# Patient Record
Sex: Male | Born: 1990 | Hispanic: No | Marital: Single | Smoking: Never smoker
Health system: Southern US, Community
[De-identification: ages and names within clinical notes are randomized; demographics above are authoritative.]

---

## 2013-06-17 ENCOUNTER — Ambulatory Visit: Payer: Medicaid Other | Attending: Internal Medicine | Admitting: Internal Medicine

## 2013-06-17 VITALS — BP 115/67 | HR 78 | Temp 98.1°F | Ht 69.0 in | Wt 114.6 lb

## 2013-06-17 DIAGNOSIS — Z008 Encounter for other general examination: Secondary | ICD-10-CM | POA: Insufficient documentation

## 2013-06-17 DIAGNOSIS — R1013 Epigastric pain: Secondary | ICD-10-CM | POA: Insufficient documentation

## 2013-06-17 DIAGNOSIS — M549 Dorsalgia, unspecified: Secondary | ICD-10-CM

## 2013-06-17 DIAGNOSIS — K219 Gastro-esophageal reflux disease without esophagitis: Secondary | ICD-10-CM | POA: Insufficient documentation

## 2013-06-17 DIAGNOSIS — H9190 Unspecified hearing loss, unspecified ear: Secondary | ICD-10-CM

## 2013-06-17 LAB — CBC WITH DIFFERENTIAL/PLATELET
BASOS ABS: 0 10*3/uL (ref 0.0–0.1)
Basophils Relative: 0 % (ref 0–1)
Eosinophils Absolute: 0.1 10*3/uL (ref 0.0–0.7)
Eosinophils Relative: 2 % (ref 0–5)
HCT: 43.3 % (ref 39.0–52.0)
HEMOGLOBIN: 15.2 g/dL (ref 13.0–17.0)
Lymphocytes Relative: 29 % (ref 12–46)
Lymphs Abs: 2 10*3/uL (ref 0.7–4.0)
MCH: 29.7 pg (ref 26.0–34.0)
MCHC: 35.1 g/dL (ref 30.0–36.0)
MCV: 84.7 fL (ref 78.0–100.0)
MONOS PCT: 13 % — AB (ref 3–12)
Monocytes Absolute: 0.9 10*3/uL (ref 0.1–1.0)
Neutro Abs: 3.8 10*3/uL (ref 1.7–7.7)
Neutrophils Relative %: 56 % (ref 43–77)
Platelets: 239 10*3/uL (ref 150–400)
RBC: 5.11 MIL/uL (ref 4.22–5.81)
RDW: 13.6 % (ref 11.5–15.5)
WBC: 6.8 10*3/uL (ref 4.0–10.5)

## 2013-06-17 LAB — COMPLETE METABOLIC PANEL WITH GFR
ALBUMIN: 4.4 g/dL (ref 3.5–5.2)
ALK PHOS: 118 U/L — AB (ref 39–117)
ALT: 17 U/L (ref 0–53)
AST: 16 U/L (ref 0–37)
BUN: 10 mg/dL (ref 6–23)
CO2: 27 mEq/L (ref 19–32)
Calcium: 9.5 mg/dL (ref 8.4–10.5)
Chloride: 102 mEq/L (ref 96–112)
Creat: 0.63 mg/dL (ref 0.50–1.35)
GFR, Est African American: >89 mL/min
GFR, Est Non African American: >89 mL/min
GLUCOSE: 83 mg/dL (ref 70–99)
Potassium: 4.1 mEq/L (ref 3.5–5.3)
Sodium: 140 mEq/L (ref 135–145)
Total Bilirubin: 0.7 mg/dL (ref 0.2–1.2)
Total Protein: 7.1 g/dL (ref 6.0–8.3)

## 2013-06-17 LAB — LIPID PANEL
CHOL/HDL RATIO: 2.9 ratio
Cholesterol: 144 mg/dL (ref 0–200)
HDL: 49 mg/dL (ref >39)
LDL CALC: 86 mg/dL (ref 0–99)
Triglycerides: 47 mg/dL (ref <150)
VLDL: 9 mg/dL (ref 0–40)

## 2013-06-17 MED ORDER — MELOXICAM 15 MG PO TABS: 15.0000 mg | ORAL_TABLET | Freq: Every day | ORAL | Status: DC

## 2013-06-17 MED ORDER — PANTOPRAZOLE SODIUM 40 MG PO TBEC: 40.0000 mg | DELAYED_RELEASE_TABLET | Freq: Every day | ORAL | Status: DC

## 2013-06-17 MED ORDER — CYCLOBENZAPRINE HCL 10 MG PO TABS: 10.0000 mg | ORAL_TABLET | Freq: Every day | ORAL | Status: DC
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
## 2013-06-17 NOTE — Progress Notes (Signed)
Patient presents in clinic to establish care. He is concerned about vomiting he has been having on and of that he states has a "green" color to it

## 2013-06-18 LAB — TSH: TSH: 3.345 u[IU]/mL (ref 0.350–4.500)

## 2013-06-27 ENCOUNTER — Telehealth: Payer: Self-pay | Admitting: Emergency Medicine

## 2013-06-27 NOTE — Telephone Encounter (Signed)
Message copied by Darlis LoanSMITH, JILL D on Mon Jun 27, 2013  5:28 PM ------      Message from: Susie CassetteABROL MD, Germain OsgoodNAYANA      Created: Wed Jun 22, 2013  4:13 PM       Notify patient of the labs are normal ------

## 2013-06-27 NOTE — Telephone Encounter (Signed)
Pt given lab results. Questioning imaging ordered for back pain. Please f/u

## 2013-07-14 ENCOUNTER — Encounter: Payer: Self-pay | Admitting: Internal Medicine

## 2013-07-14 ENCOUNTER — Ambulatory Visit: Payer: Medicaid Other | Attending: Internal Medicine | Admitting: Internal Medicine

## 2013-07-14 VITALS — BP 109/69 | HR 56 | Temp 98.2°F | Resp 16 | Ht 70.0 in | Wt 144.0 lb

## 2013-07-14 DIAGNOSIS — M545 Low back pain, unspecified: Secondary | ICD-10-CM | POA: Insufficient documentation

## 2013-07-14 DIAGNOSIS — Z791 Long term (current) use of non-steroidal anti-inflammatories (NSAID): Secondary | ICD-10-CM | POA: Insufficient documentation

## 2013-07-14 DIAGNOSIS — Z79899 Other long term (current) drug therapy: Secondary | ICD-10-CM | POA: Insufficient documentation

## 2013-07-14 DIAGNOSIS — M549 Dorsalgia, unspecified: Secondary | ICD-10-CM

## 2013-07-14 MED ORDER — CYCLOBENZAPRINE HCL 10 MG PO TABS: 10.0000 mg | ORAL_TABLET | Freq: Every day | ORAL | Status: DC

## 2013-07-14 MED ORDER — MELOXICAM 15 MG PO TABS: 15.0000 mg | ORAL_TABLET | Freq: Every day | ORAL | Status: DC

## 2013-07-14 NOTE — Patient Instructions (Signed)
Back Pain, Adult Low back pain is very common. About 1 in 5 people have back pain.The cause of low back pain is rarely dangerous. The pain often gets better over time.About half of people with a sudden onset of back pain feel better in just 2 weeks. About 8 in 10 people feel better by 6 weeks.  CAUSES Some common causes of back pain include:  Strain of the muscles or ligaments supporting the spine.  Wear and tear (degeneration) of the spinal discs.  Arthritis.  Direct injury to the back. DIAGNOSIS Most of the time, the direct cause of low back pain is not known.However, back pain can be treated effectively even when the exact cause of the pain is unknown.Answering your caregiver's questions about your overall health and symptoms is one of the most accurate ways to make sure the cause of your pain is not dangerous. If your caregiver needs more information, he or she may order lab work or imaging tests (X-rays or MRIs).However, even if imaging tests show changes in your back, this usually does not require surgery. HOME CARE INSTRUCTIONS For many people, back pain returns.Since low back pain is rarely dangerous, it is often a condition that people can learn to manageon their own.   Remain active. It is stressful on the back to sit or stand in one place. Do not sit, drive, or stand in one place for more than 30 minutes at a time. Take short walks on level surfaces as soon as pain allows.Try to increase the length of time you walk each day.  Do not stay in bed.Resting more than 1 or 2 days can delay your recovery.  Do not avoid exercise or work.Your body is made to move.It is not dangerous to be active, even though your back may hurt.Your back will likely heal faster if you return to being active before your pain is gone.  Pay attention to your body when you bend and lift. Many people have less discomfortwhen lifting if they bend their knees, keep the load close to their bodies,and  avoid twisting. Often, the most comfortable positions are those that put less stress on your recovering back.  Find a comfortable position to sleep. Use a firm mattress and lie on your side with your knees slightly bent. If you lie on your back, put a pillow under your knees.  Only take over-the-counter or prescription medicines as directed by your caregiver. Over-the-counter medicines to reduce pain and inflammation are often the most helpful.Your caregiver may prescribe muscle relaxant drugs.These medicines help dull your pain so you can more quickly return to your normal activities and healthy exercise.  Put ice on the injured area.  Put ice in a plastic bag.  Place a towel between your skin and the bag.  Leave the ice on for 15-20 minutes, 03-04 times a day for the first 2 to 3 days. After that, ice and heat may be alternated to reduce pain and spasms.  Ask your caregiver about trying back exercises and gentle massage. This may be of some benefit.  Avoid feeling anxious or stressed.Stress increases muscle tension and can worsen back pain.It is important to recognize when you are anxious or stressed and learn ways to manage it.Exercise is a great option. SEEK MEDICAL CARE IF:  You have pain that is not relieved with rest or medicine.  You have pain that does not improve in 1 week.  You have new symptoms.  You are generally not feeling well. SEEK   IMMEDIATE MEDICAL CARE IF:   You have pain that radiates from your back into your legs.  You develop new bowel or bladder control problems.  You have unusual weakness or numbness in your arms or legs.  You develop nausea or vomiting.  You develop abdominal pain.  You feel faint. Document Released: 03/17/2005 Document Revised: 09/16/2011 Document Reviewed: 08/05/2010 [employer] Patient Information 2014 [employer].  
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
## 2013-07-14 NOTE — Progress Notes (Signed)
Pt is here following up on his chronic back pain.

## 2013-07-18 ENCOUNTER — Ambulatory Visit (HOSPITAL_COMMUNITY)
Admission: RE | Admit: 2013-07-18 | Discharge: 2013-07-18 | Disposition: A | Discharge status: Home / Self Care | Payer: Medicaid Other | Source: Ambulatory Visit | Attending: Internal Medicine | Admitting: Internal Medicine

## 2013-07-18 ENCOUNTER — Telehealth: Payer: Self-pay | Admitting: *Deleted

## 2013-07-18 DIAGNOSIS — M545 Low back pain, unspecified: Secondary | ICD-10-CM | POA: Insufficient documentation

## 2013-07-18 DIAGNOSIS — M549 Dorsalgia, unspecified: Secondary | ICD-10-CM

## 2013-07-18 NOTE — Telephone Encounter (Signed)
Left message

## 2013-07-18 NOTE — Telephone Encounter (Signed)
Message copied by Raynelle CharyWINFREE, Vickie Melnik R on Mon Jul 18, 2013  2:19 PM ------      Message from: Jeanann LewandowskyJEGEDE, OLUGBEMIGA E      Created: Mon Jul 18, 2013  1:34 PM       Please inform patient that his x-ray is normal ------

## 2013-07-18 NOTE — Telephone Encounter (Signed)
Pt is aware of his lab results. 

## 2013-07-18 NOTE — Telephone Encounter (Signed)
Message copied by Raynelle CharyWINFREE, Artemisa Sladek R on Mon Jul 18, 2013  2:34 PM ------      Message from: Jeanann LewandowskyJEGEDE, OLUGBEMIGA E      Created: Mon Jul 18, 2013  1:34 PM       Please inform patient that his x-ray is normal ------

## 2013-09-08 ENCOUNTER — Ambulatory Visit
Admission: RE | Admit: 2013-09-08 | Discharge: 2013-09-08 | Disposition: A | Discharge status: Home / Self Care | Payer: No Typology Code available for payment source | Source: Ambulatory Visit | Attending: Infectious Disease | Admitting: Infectious Disease

## 2013-09-08 ENCOUNTER — Other Ambulatory Visit: Payer: Self-pay | Admitting: Infectious Disease

## 2013-09-08 DIAGNOSIS — R7611 Nonspecific reaction to tuberculin skin test without active tuberculosis: Secondary | ICD-10-CM

## 2013-11-22 ENCOUNTER — Ambulatory Visit: Payer: Medicaid Other | Attending: Internal Medicine

## 2013-12-29 ENCOUNTER — Ambulatory Visit: Payer: Self-pay | Attending: Internal Medicine | Admitting: *Deleted

## 2013-12-29 DIAGNOSIS — Z23 Encounter for immunization: Secondary | ICD-10-CM

## 2014-01-05 ENCOUNTER — Ambulatory Visit: Payer: Self-pay | Attending: Internal Medicine | Admitting: Internal Medicine

## 2014-01-05 ENCOUNTER — Encounter: Payer: Self-pay | Admitting: Internal Medicine

## 2014-01-05 VITALS — BP 110/60 | HR 50 | Temp 97.7°F | Resp 16 | Wt 126.0 lb

## 2014-01-05 DIAGNOSIS — Z791 Long term (current) use of non-steroidal anti-inflammatories (NSAID): Secondary | ICD-10-CM | POA: Insufficient documentation

## 2014-01-05 DIAGNOSIS — K219 Gastro-esophageal reflux disease without esophagitis: Secondary | ICD-10-CM

## 2014-01-05 DIAGNOSIS — Z79899 Other long term (current) drug therapy: Secondary | ICD-10-CM | POA: Insufficient documentation

## 2014-01-05 DIAGNOSIS — M545 Low back pain, unspecified: Secondary | ICD-10-CM

## 2014-01-05 DIAGNOSIS — Z23 Encounter for immunization: Secondary | ICD-10-CM | POA: Insufficient documentation

## 2014-01-05 DIAGNOSIS — G8929 Other chronic pain: Secondary | ICD-10-CM | POA: Insufficient documentation

## 2014-01-05 MED ORDER — MELOXICAM 15 MG PO TABS: 15.0000 mg | ORAL_TABLET | Freq: Every day | ORAL | Status: DC

## 2014-01-05 MED ORDER — PANTOPRAZOLE SODIUM 40 MG PO TBEC: 40.0000 mg | DELAYED_RELEASE_TABLET | Freq: Every day | ORAL | Status: DC

## 2014-01-05 MED ORDER — CYCLOBENZAPRINE HCL 10 MG PO TABS: 10.0000 mg | ORAL_TABLET | Freq: Every day | ORAL | Status: DC

## 2014-01-05 NOTE — Progress Notes (Signed)
Patient here for follow up on his back pain  

## 2014-01-05 NOTE — Progress Notes (Signed)
MRN: [id number] Name: Cory Francis  Sex: male Age: 23 y.o. DOB: [birth year]  Allergies: Review of patient's allergies indicates no known allergies.  Chief Complaint  Patient presents with  . Follow-up    HPI: Patient is 23 y.o. male who has to of chronic lower back pain, GERD comes today for followup patient reports improvement in the symptoms had an x-ray done in the past which was reported to be negative, patient takes meloxicam and Flexeril , also noticed his blood pressure is low side, repeat manual blood pressure is 110/60, he denies any headache dizziness chest and shortness of breath. Patient is up-to-date with flu shot.  History reviewed. No pertinent past medical history.  History reviewed. No pertinent past surgical history.    Medication List       This list is accurate as of: 01/05/14 10:38 AM.  Always use your most recent med list.               cyclobenzaprine 10 MG tablet  Commonly known as:  FLEXERIL  Take 1 tablet (10 mg total) by mouth at bedtime.     meloxicam 15 MG tablet  Commonly known as:  MOBIC  Take 1 tablet (15 mg total) by mouth daily.     pantoprazole 40 MG tablet  Commonly known as:  PROTONIX  Take 1 tablet (40 mg total) by mouth daily.        Meds ordered this encounter  Medications  . pantoprazole (PROTONIX) 40 MG tablet    Sig: Take 1 tablet (40 mg total) by mouth daily.    Dispense:  30 tablet    Refill:  3  . meloxicam (MOBIC) 15 MG tablet    Sig: Take 1 tablet (15 mg total) by mouth daily.    Dispense:  30 tablet    Refill:  3  . cyclobenzaprine (FLEXERIL) 10 MG tablet    Sig: Take 1 tablet (10 mg total) by mouth at bedtime.    Dispense:  30 tablet    Refill:  0    Immunization History  Administered Date(s) Administered  . Influenza,inj,Quad PF,36+ Mos 12/29/2013    Family History  Problem Relation Age of Onset  . Diabetes Maternal Uncle   . Stroke Maternal Grandmother     History  Substance Use Topics    . Smoking status: Never Smoker   . Smokeless tobacco: Not on file  . Alcohol Use: Not on file    Review of Systems   As noted in HPI  Filed Vitals:   01/05/14 1038  BP: 110/60  Pulse:   Temp:   Resp:     Physical Exam  Physical Exam  Constitutional: No distress.  Eyes: EOM are normal. Pupils are equal, round, and reactive to light.  Cardiovascular: Normal rate and regular rhythm.   Pulmonary/Chest: Breath sounds normal. No respiratory distress. He has no wheezes. He has no rales.  Musculoskeletal:  Minimal lower lumbar paraspinal tenderness, SLR test negative     CBC    Component Value Date/Time   WBC 6.8 06/17/2013 1221   RBC 5.11 06/17/2013 1221   HGB 15.2 06/17/2013 1221   HCT 43.3 06/17/2013 1221   PLT 239 06/17/2013 1221   MCV 84.7 06/17/2013 1221   LYMPHSABS 2.0 06/17/2013 1221   MONOABS 0.9 06/17/2013 1221   EOSABS 0.1 06/17/2013 1221   BASOSABS 0.0 06/17/2013 1221    CMP     Component Value Date/Time   NA 140 06/17/2013 1221  K 4.1 06/17/2013 1221   CL 102 06/17/2013 1221   CO2 27 06/17/2013 1221   GLUCOSE 83 06/17/2013 1221   BUN 10 06/17/2013 1221   CREATININE 0.63 06/17/2013 1221   CALCIUM 9.5 06/17/2013 1221   PROT 7.1 06/17/2013 1221   ALBUMIN 4.4 06/17/2013 1221   AST 16 06/17/2013 1221   ALT 17 06/17/2013 1221   ALKPHOS 118* 06/17/2013 1221   BILITOT 0.7 06/17/2013 1221   GFRNONAA >89 06/17/2013 1221   GFRAA >89 06/17/2013 1221    Lab Results  Component Value Date/Time   CHOL 144 06/17/2013 12:21 PM    No components found with this basename: hga1c    Lab Results  Component Value Date/Time   AST 16 06/17/2013 12:21 PM    Assessment and Plan  Gastroesophageal reflux disease, esophagitis presence not specified - Plan: Advised patient for lifestyle modification continue with pantoprazole (PROTONIX) 40 MG tablet  Chronic low back pain - Plan: Advised patient to apply heating pad, medication refill done. meloxicam (MOBIC) 15 MG tablet, cyclobenzaprine  (FLEXERIL) 10 MG tablet   Health Maintenance Flu shot  uptodate   Return in about 6 months (around 07/07/2014) for back pain.  Doris CheadleADVANI, Blakleigh Straw, MD

## 2014-01-13 ENCOUNTER — Ambulatory Visit: Payer: Medicaid Other | Admitting: Internal Medicine

## 2014-05-19 ENCOUNTER — Emergency Department (HOSPITAL_COMMUNITY)
Admission: EM | Admit: 2014-05-19 | Discharge: 2014-05-19 | Disposition: A | Discharge status: Home / Self Care | Payer: BLUE CROSS/BLUE SHIELD | Source: Home / Self Care | Attending: Family Medicine | Admitting: Family Medicine

## 2014-05-19 ENCOUNTER — Encounter (HOSPITAL_COMMUNITY): Payer: Self-pay

## 2014-05-19 ENCOUNTER — Other Ambulatory Visit: Payer: Self-pay

## 2014-05-19 DIAGNOSIS — S29011A Strain of muscle and tendon of front wall of thorax, initial encounter: Secondary | ICD-10-CM

## 2014-05-19 DIAGNOSIS — S2341XA Sprain of ribs, initial encounter: Secondary | ICD-10-CM

## 2014-05-19 NOTE — Discharge Instructions (Signed)
You have strained a small muscle in between your ribs on the left side of your chest. Please use tylenol or ibuprofen as directed on packaging for discomfort and expect improvement over the next several days. If symptoms do not improve over the next 7-10 days, please follow up with your doctor.  Muscle Strain A muscle strain (pulled muscle) happens when a muscle is stretched beyond normal length. It happens when a sudden, violent force stretches your muscle too far. Usually, a few of the fibers in your muscle are torn. Muscle strain is common in athletes. Recovery usually takes 1-2 weeks. Complete healing takes 5-6 weeks.  HOME CARE   Follow the PRICE method of treatment to help your injury get better. Do this the first 2-3 days after the injury:  Protect. Protect the muscle to keep it from getting injured again.  Rest. Limit your activity and rest the injured body part.  Ice. Put ice in a plastic bag. Place a towel between your skin and the bag. Then, apply the ice and leave it on from 15-20 minutes each hour. After the third day, switch to moist heat packs.  Compression. Use a splint or elastic bandage on the injured area for comfort. Do not put it on too tightly.  Elevate. Keep the injured body part above the level of your heart.  Only take medicine as told by your doctor.  Warm up before doing exercise to prevent future muscle strains. GET HELP IF:   You have more pain or puffiness (swelling) in the injured area.  You feel numbness, tingling, or notice a loss of strength in the injured area. MAKE SURE YOU:   Understand these instructions.  Will watch your condition.  Will get help right away if you are not doing well or get worse. Document Released: 12/25/2007 Document Revised: 01/05/2013 Document Reviewed: 10/14/2012 [facility] Patient Information 2015 [place], [place]. This information is not intended to replace advice given to you by your health care provider. Make sure you  discuss any questions you have with your health care provider.

## 2014-05-19 NOTE — ED Provider Notes (Signed)
CSN: [id number]     Arrival date & time 05/19/14  1157 History   First MD Initiated Contact with Patient 05/19/14 1221     Chief Complaint  Patient presents with  . Chest Pain   (Consider location/radiation/quality/duration/timing/severity/associated sxs/prior Treatment) HPI Comments: Patient noticed this discomfort while at work 2 days ago. 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 Works in [employer].  Is symptom free as long as he is not touching area of discomfort. No injury. No palpitations, dyspnea, diaphoresis, nausea, fever, rash, syncope.  PCP: [facility] Nonsmoker No personal or family hx of CAD  Patient is a 24 y.o. male presenting with chest pain. The history is provided by the patient.  Chest Pain Pain location:  L chest (small finger tip sized area of point tenderness at left breast in intercostal space just below left nipple.) Associated symptoms: no back pain, no dizziness and no weakness     History reviewed. No pertinent past medical history. History reviewed. No pertinent past surgical history. Family History  Problem Relation Age of Onset  . Diabetes Maternal Uncle   . Stroke Maternal Grandmother    History  Substance Use Topics  . Smoking status: Never Smoker   . Smokeless tobacco: Not on file  . Alcohol Use: Not on file    Review of Systems  Constitutional: Negative.   HENT: Negative.   Respiratory: Negative.   Cardiovascular: Positive for chest pain.       See HPI  Gastrointestinal: Negative.   Musculoskeletal: Negative for back pain.  Skin: Negative.   Neurological: Negative for dizziness, syncope, weakness and light-headedness.    Allergies  Review of patient's allergies indicates no known allergies.  Home Medications   Prior to Admission medications   Medication Sig Start Date End Date Taking? Authorizing Provider  cyclobenzaprine (FLEXERIL) 10 MG tablet Take 1 tablet (10 mg total) by mouth at bedtime. 01/05/14   Doris Cheadle, MD    meloxicam (MOBIC) 15 MG tablet Take 1 tablet (15 mg total) by mouth daily. 01/05/14   Doris Cheadle, MD  pantoprazole (PROTONIX) 40 MG tablet Take 1 tablet (40 mg total) by mouth daily. 01/05/14   Deepak Advani, MD   BP 109/64 mmHg  Pulse 60  Temp(Src) 98 F (36.7 C) (Oral)  Resp 20  SpO2 97% Physical Exam  Constitutional: He is oriented to person, place, and time. He appears well-developed and well-nourished. No distress.  HENT:  Head: Normocephalic and atraumatic.  Cardiovascular: Normal rate, regular rhythm and normal heart sounds.   Pulmonary/Chest: Effort normal and breath sounds normal. He exhibits tenderness. Left breast exhibits no inverted nipple, no mass, no nipple discharge and no skin change. Breasts are symmetrical.    Abdominal: Soft. Bowel sounds are normal. He exhibits no distension. There is no tenderness.  Musculoskeletal: Normal range of motion.  Neurological: He is alert and oriented to person, place, and time.  Skin: Skin is warm, dry and intact. No abrasion, no bruising, no ecchymosis, no laceration, no lesion and no rash noted. No erythema.  Psychiatric: He has a normal mood and affect. His behavior is normal.  Nursing note and vitals reviewed.   ED Course  Procedures (including critical care time) Labs Review Labs Reviewed - No data to display  Imaging Review No results found.   MDM   1. Intercostal muscle strain, initial encounter     Strain of left intercostal muscle. Tylenol or ibuprofen as directed on packaging. Expect gradual improvement over next  few days.     Ria ClockJennifer Lee H Presson, [place] 05/19/14 1259

## 2014-05-19 NOTE — ED Notes (Signed)
C/o 2 day duration o fpain in left anterior chest . Pain not exacerbated by breathing , ROM of arms, but c/o worse w his pressing on his left chest. Denies n/v/SOB/injury. W/d/color good

## 2014-05-19 NOTE — Progress Notes (Unsigned)
Patient came into the office today having some discomfort to his left Breast area Patient not sure if he pulled a muscle Denies any sob Patient was not in any distress Patient was instructed to go to the urgent care to be evaluated and possible chest x ray

## 2014-05-24 ENCOUNTER — Ambulatory Visit: Payer: Self-pay | Admitting: Internal Medicine

## 2014-06-14 ENCOUNTER — Ambulatory Visit: Payer: BLUE CROSS/BLUE SHIELD

## 2014-06-21 ENCOUNTER — Ambulatory Visit: Payer: BLUE CROSS/BLUE SHIELD | Attending: Internal Medicine | Admitting: Internal Medicine

## 2014-06-21 ENCOUNTER — Encounter: Payer: Self-pay | Admitting: Internal Medicine

## 2014-06-21 VITALS — BP 116/65 | HR 60 | Temp 98.6°F | Resp 16 | Wt 128.6 lb

## 2014-06-21 DIAGNOSIS — M545 Low back pain, unspecified: Secondary | ICD-10-CM

## 2014-06-21 DIAGNOSIS — G8929 Other chronic pain: Secondary | ICD-10-CM | POA: Diagnosis not present

## 2014-06-21 DIAGNOSIS — K219 Gastro-esophageal reflux disease without esophagitis: Secondary | ICD-10-CM | POA: Diagnosis not present

## 2014-06-21 DIAGNOSIS — R1013 Epigastric pain: Secondary | ICD-10-CM | POA: Diagnosis not present

## 2014-06-21 DIAGNOSIS — R109 Unspecified abdominal pain: Secondary | ICD-10-CM | POA: Diagnosis not present

## 2014-06-21 LAB — COMPLETE METABOLIC PANEL WITH GFR
ALBUMIN: 4.6 g/dL (ref 3.5–5.2)
ALK PHOS: 119 U/L — AB (ref 39–117)
ALT: 31 U/L (ref 0–53)
AST: 19 U/L (ref 0–37)
BUN: 10 mg/dL (ref 6–23)
CO2: 28 mEq/L (ref 19–32)
Calcium: 10.1 mg/dL (ref 8.4–10.5)
Chloride: 104 mEq/L (ref 96–112)
Creat: 0.66 mg/dL (ref 0.50–1.35)
GFR, Est African American: >89 mL/min
GFR, Est Non African American: >89 mL/min
Glucose, Bld: 90 mg/dL (ref 70–99)
POTASSIUM: 4.8 meq/L (ref 3.5–5.3)
SODIUM: 140 meq/L (ref 135–145)
TOTAL PROTEIN: 7.3 g/dL (ref 6.0–8.3)
Total Bilirubin: 0.8 mg/dL (ref 0.2–1.2)

## 2014-06-21 NOTE — Progress Notes (Signed)
Patient here for follow up. Patient complains of flexeril makes him sleepy all day the next day.  Patient has started taking flexeril PRN d/t sleepiness.

## 2014-06-21 NOTE — Progress Notes (Signed)
MRN: [id number] Name: Cory Francis  Sex: male Age: 24 y.o. DOB: [birth year]  Allergies: Review of patient's allergies indicates no known allergies.  Chief Complaint  Patient presents with  . Follow-up    HPI: Patient is 24 y.o. male who has history GERD, chronic low back pain comes today for followup her, as per patient she's not taking Flexeril because it makes him drowsy his back pain is controlled today his major concern is on and off epigastric/right upper quadrant pain as per patient it especially happens when he eats certain kind of food denies any nausea vomiting change in bowel habits.patient denies drinking alcohol or smoking cigarettes.  History reviewed. No pertinent past medical history.  History reviewed. No pertinent past surgical history.    Medication List       This list is accurate as of: 06/21/14 12:01 PM.  Always use your most recent med list.               cyclobenzaprine 10 MG tablet  Commonly known as:  FLEXERIL  Take 1 tablet (10 mg total) by mouth at bedtime.     meloxicam 15 MG tablet  Commonly known as:  MOBIC  Take 1 tablet (15 mg total) by mouth daily.     pantoprazole 40 MG tablet  Commonly known as:  PROTONIX  Take 1 tablet (40 mg total) by mouth daily.        No orders of the defined types were placed in this encounter.    Immunization History  Administered Date(s) Administered  . Influenza,inj,Quad PF,36+ Mos 12/29/2013    Family History  Problem Relation Age of Onset  . Diabetes Maternal Uncle   . Stroke Maternal Grandmother     History  Substance Use Topics  . Smoking status: Never Smoker   . Smokeless tobacco: Not on file  . Alcohol Use: Not on file    Review of Systems   As noted in HPI  Filed Vitals:   06/21/14 1137  BP: 116/65  Pulse: 60  Temp: 98.6 F (37 C)  Resp: 16    Physical Exam  Physical Exam  Constitutional: No distress.  Eyes: EOM are normal. Pupils are equal, round, and reactive  to light.  Cardiovascular: Normal rate and regular rhythm.   Pulmonary/Chest: Breath sounds normal. No respiratory distress. He has no wheezes. He has no rales.  Abdominal:  Minimal tenderness in the epigastric area with deep palpation no rebound or guarding  Musculoskeletal: He exhibits no edema.    CBC    Component Value Date/Time   WBC 6.8 06/17/2013 1221   RBC 5.11 06/17/2013 1221   HGB 15.2 06/17/2013 1221   HCT 43.3 06/17/2013 1221   PLT 239 06/17/2013 1221   MCV 84.7 06/17/2013 1221   LYMPHSABS 2.0 06/17/2013 1221   MONOABS 0.9 06/17/2013 1221   EOSABS 0.1 06/17/2013 1221   BASOSABS 0.0 06/17/2013 1221    CMP     Component Value Date/Time   NA 140 06/17/2013 1221   K 4.1 06/17/2013 1221   CL 102 06/17/2013 1221   CO2 27 06/17/2013 1221   GLUCOSE 83 06/17/2013 1221   BUN 10 06/17/2013 1221   CREATININE 0.63 06/17/2013 1221   CALCIUM 9.5 06/17/2013 1221   PROT 7.1 06/17/2013 1221   ALBUMIN 4.4 06/17/2013 1221   AST 16 06/17/2013 1221   ALT 17 06/17/2013 1221   ALKPHOS 118* 06/17/2013 1221   BILITOT 0.7 06/17/2013 1221   GFRNONAA >89  06/17/2013 1221   GFRAA >89 06/17/2013 1221    Lab Results  Component Value Date/Time   CHOL 144 06/17/2013 12:21 PM    No components found for: HGA1C  Lab Results  Component Value Date/Time   AST 16 06/17/2013 12:21 PM    Assessment and Plan  Gastroesophageal reflux disease, esophagitis presence not specified - Plan: lifestyle modification, continue with Protonix COMPLETE METABOLIC PANEL WITH GFR  Chronic low back pain MOBIC when necessary.  Abdominal pain, chronic, epigastric - Plan: US Abdomen Complete, COMPLETE METABOLIC PANEL WITH GFR   Health Maintenance  -Vaccinations:  Up-to-date with flu shot  Return in about 6 months (around 12/22/2014), or if symptoms worsen or fail to improve.   This note has been created with Education officer, environmentalDragon speech recognition software and smart phrase technology. Any transcriptional errors  are unintentional.    Doris CheadleADVANI, Coriann Brouhard, MD

## 2014-06-26 ENCOUNTER — Telehealth: Payer: Self-pay

## 2014-06-26 NOTE — Telephone Encounter (Signed)
-----   Message from Doris Cheadleeepak Advani, MD sent at 06/22/2014 12:52 PM EDT ----- Blood work reviewed noticed impaired fasting glucose, call and advise patient for low carbohydrate diet.

## 2014-06-26 NOTE — Telephone Encounter (Signed)
Patient is aware of his lab results 

## 2014-07-03 ENCOUNTER — Ambulatory Visit (HOSPITAL_COMMUNITY): Payer: BLUE CROSS/BLUE SHIELD | Attending: Internal Medicine

## 2014-08-21 ENCOUNTER — Ambulatory Visit: Payer: BLUE CROSS/BLUE SHIELD

## 2014-09-12 ENCOUNTER — Ambulatory Visit: Payer: BLUE CROSS/BLUE SHIELD | Attending: Internal Medicine

## 2015-03-07 ENCOUNTER — Ambulatory Visit: Payer: Self-pay | Admitting: Family Medicine

## 2015-03-22 ENCOUNTER — Encounter: Payer: Self-pay | Admitting: Family Medicine

## 2015-03-22 ENCOUNTER — Ambulatory Visit (HOSPITAL_COMMUNITY)
Admission: RE | Admit: 2015-03-22 | Discharge: 2015-03-22 | Disposition: A | Discharge status: Home / Self Care | Payer: Self-pay | Source: Ambulatory Visit | Attending: Cardiology | Admitting: Cardiology

## 2015-03-22 ENCOUNTER — Ambulatory Visit: Payer: Self-pay | Attending: Family Medicine | Admitting: Family Medicine

## 2015-03-22 ENCOUNTER — Other Ambulatory Visit: Payer: Self-pay

## 2015-03-22 VITALS — BP 101/60 | HR 60 | Temp 97.8°F | Resp 16 | Ht 70.0 in | Wt 132.0 lb

## 2015-03-22 DIAGNOSIS — Z79899 Other long term (current) drug therapy: Secondary | ICD-10-CM | POA: Insufficient documentation

## 2015-03-22 DIAGNOSIS — K219 Gastro-esophageal reflux disease without esophagitis: Secondary | ICD-10-CM | POA: Insufficient documentation

## 2015-03-22 DIAGNOSIS — Z029 Encounter for administrative examinations, unspecified: Secondary | ICD-10-CM | POA: Insufficient documentation

## 2015-03-22 DIAGNOSIS — R0789 Other chest pain: Secondary | ICD-10-CM | POA: Insufficient documentation

## 2015-03-22 DIAGNOSIS — Z833 Family history of diabetes mellitus: Secondary | ICD-10-CM | POA: Insufficient documentation

## 2015-03-22 DIAGNOSIS — Z114 Encounter for screening for human immunodeficiency virus [HIV]: Secondary | ICD-10-CM | POA: Insufficient documentation

## 2015-03-22 LAB — POCT GLYCOSYLATED HEMOGLOBIN (HGB A1C): Hemoglobin A1C: 5.1

## 2015-03-22 LAB — GLUCOSE, POCT (MANUAL RESULT ENTRY): POC Glucose: 83 mg/dl (ref 70–99)

## 2015-03-22 MED ORDER — PANTOPRAZOLE SODIUM 40 MG PO TBEC: 40.0000 mg | DELAYED_RELEASE_TABLET | Freq: Every day | ORAL | Status: DC | PRN

## 2015-03-22 MED ORDER — NAPROXEN 500 MG PO TABS: 500.0000 mg | ORAL_TABLET | Freq: Two times a day (BID) | ORAL | Status: DC

## 2015-03-22 NOTE — Assessment & Plan Note (Signed)
Family history of diabetes Normal A1c and CBG Patient provided info on low carb diet

## 2015-03-22 NOTE — Progress Notes (Signed)
Patient ID: Cory Francis, male   DOB: Mar 30, 1991, 24 y.o.   MRN: [id number]   Subjective:  Patient ID: Cory Francis, male    DOB: Apr 05, 1990  Age: 24 y.o. MRN: [id number]  CC: Chest Pain   HPI [facility] presents for    1. Chest pain: started two days ago. L parasternal. Sharp pain. Mild. No injury or heavy lifting. He denies GERD and not taking PPI. No SOB.  2. HM: declines flu. Amenable to screening HIV. Sexually active at times with inconsistent condom use.   3. fam hx of diabetes: mom and dad with diabetes. He is requesting screening. No polyuria or polydipsia.   Social History  Substance Use Topics  . Smoking status: Never Smoker   . Smokeless tobacco: Not on file  . Alcohol Use: No    Outpatient Prescriptions Prior to Visit  Medication Sig Dispense Refill  . cyclobenzaprine (FLEXERIL) 10 MG tablet Take 1 tablet (10 mg total) by mouth at bedtime. (Patient not taking: Reported on 03/22/2015) 30 tablet 0  . meloxicam (MOBIC) 15 MG tablet Take 1 tablet (15 mg total) by mouth daily. (Patient not taking: Reported on 03/22/2015) 30 tablet 3  . pantoprazole (PROTONIX) 40 MG tablet Take 1 tablet (40 mg total) by mouth daily. (Patient not taking: Reported on 03/22/2015) 30 tablet 3   No facility-administered medications prior to visit.    ROS Review of Systems  Constitutional: Negative for fever, chills, fatigue and unexpected weight change.  Eyes: Negative for visual disturbance.  Respiratory: Negative for cough and shortness of breath.   Cardiovascular: Positive for chest pain. Negative for palpitations and leg swelling.  Gastrointestinal: Negative for nausea, vomiting, abdominal pain, diarrhea, constipation and blood in stool.  Endocrine: Negative for polydipsia, polyphagia and polyuria.  Musculoskeletal: Negative for myalgias, back pain, arthralgias, gait problem and neck pain.  Skin: Negative for rash.  Allergic/Immunologic: Negative for immunocompromised state.   Hematological: Negative for adenopathy. Does not bruise/bleed easily.  Psychiatric/Behavioral: Negative for suicidal ideas, sleep disturbance and dysphoric mood. The patient is not nervous/anxious.     Objective:  BP 101/60 mmHg  Pulse 60  Temp(Src) 97.8 F (36.6 C) (Oral)  Resp 16  Ht  (1.778 m)  Wt 132 lb (59.875 kg)  BMI 18.94 kg/m2  SpO2 100%  BP/Weight 03/22/2015 06/21/2014 05/19/2014  Systolic BP 101 116 109  Diastolic BP 60 65 64  Wt. (Lbs) 132 128.6 -  BMI 18.94 18.45 -   Physical Exam  Constitutional: He appears well-developed and well-nourished. No distress.  HENT:  Head: Normocephalic and atraumatic.  Neck: Normal range of motion. Neck supple.  Cardiovascular: Normal rate, regular rhythm, normal heart sounds and intact distal pulses.   Pulmonary/Chest: Effort normal and breath sounds normal.  Musculoskeletal: He exhibits no edema.  Neurological: He is alert.  Skin: Skin is warm and dry. No rash noted. No erythema.  Psychiatric: He has a normal mood and affect.   EKG: normal EKG, normal sinus rhythm, sinus bradycardia.  Lab Results  Component Value Date   HGBA1C 5.10 03/22/2015   CBG 83  Assessment & Plan:   Problem List Items Addressed This Visit    Family history of diabetes mellitus   Relevant Orders   HgB A1c   Glucose (CBG)   Musculoskeletal chest pain - Primary (Chronic)   Relevant Medications   naproxen (NAPROSYN) 500 MG tablet    Other Visit Diagnoses    Screening for HIV (human immunodeficiency virus)  Relevant Orders    HIV antibody (with reflex)       No orders of the defined types were placed in this encounter.    Follow-up: No Follow-up on file.   Dessa PhiJosalyn Cherylin Waguespack MD

## 2015-03-22 NOTE — Patient Instructions (Signed)
Cory Francis was seen today for chest pain.  Diagnoses and all orders for this visit:  Musculoskeletal chest pain -     naproxen (NAPROSYN) 500 MG tablet; Take 1 tablet (500 mg total) by mouth 2 (two) times daily with a meal.  Screening for HIV (human immunodeficiency virus) -     HIV antibody (with reflex); Future  Family history of diabetes mellitus -     HgB A1c -     Glucose (CBG)    F/u in one year for wellness physical, sooner if needed  Dr. Armen PickupFunches    Basic Carbohydrate Counting for Diabetes Mellitus Carbohydrate counting is a method for keeping track of the amount of carbohydrates you eat. Eating carbohydrates naturally increases the level of sugar (glucose) in your blood, so it is important for you to know the amount that is okay for you to have in every meal. Carbohydrate counting helps keep the level of glucose in your blood within normal limits. The amount of carbohydrates allowed is different for every person. A dietitian can help you calculate the amount that is right for you. Once you know the amount of carbohydrates you can have, you can count the carbohydrates in the foods you want to eat. Carbohydrates are found in the following foods:  Grains, such as breads and cereals.  Dried beans and soy products.  Starchy vegetables, such as potatoes, peas, and corn.  Fruit and fruit juices.  Milk and yogurt.  Sweets and snack foods, such as cake, cookies, candy, chips, soft drinks, and fruit drinks. CARBOHYDRATE COUNTING There are two ways to count the carbohydrates in your food. You can use either of the methods or a combination of both. Reading the "Nutrition Facts" on Packaged Food The "Nutrition Facts" is an area that is included on the labels of almost all packaged food and beverages in the [place]. It includes the serving size of that food or beverage and information about the nutrients in each serving of the food, including the grams (g) of carbohydrate per  serving.  Decide the number of servings of this food or beverage that you will be able to eat or drink. Multiply that number of servings by the number of grams of carbohydrate that is listed on the label for that serving. The total will be the amount of carbohydrates you will be having when you eat or drink this food or beverage. Learning Standard Serving Sizes of Food When you eat food that is not packaged or does not include "Nutrition Facts" on the label, you need to measure the servings in order to count the amount of carbohydrates.A serving of most carbohydrate-rich foods contains about 15 g of carbohydrates. The following list includes serving sizes of carbohydrate-rich foods that provide 15 g ofcarbohydrate per serving:   1 slice of bread (1 oz) or 1 six-inch tortilla.    of a hamburger bun or English muffin.  4-6 crackers.   cup unsweetened dry cereal.    cup hot cereal.   cup rice or pasta.    cup mashed potatoes or  of a large baked potato.  1 cup fresh fruit or one small piece of fruit.    cup canned or frozen fruit or fruit juice.  1 cup milk.   cup plain fat-free yogurt or yogurt sweetened with artificial sweeteners.   cup cooked dried beans or starchy vegetable, such as peas, corn, or potatoes.  Decide the number of standard-size servings that you will eat. Multiply that number  of servings by 15 (the grams of carbohydrates in that serving). For example, if you eat 2 cups of strawberries, you will have eaten 2 servings and 30 g of carbohydrates (2 servings x 15 g = 30 g). For foods such as soups and casseroles, in which more than one food is mixed in, you will need to count the carbohydrates in each food that is included. EXAMPLE OF CARBOHYDRATE COUNTING Sample Dinner  3 oz chicken breast.   cup of brown rice.   cup of corn.  1 cup milk.   1 cup strawberries with sugar-free whipped topping.  Carbohydrate Calculation Step 1: Identify the foods  that contain carbohydrates:   Rice.   Corn.   Milk.   Strawberries. Step 2:Calculate the number of servings eaten of each:   2 servings of rice.   1 serving of corn.   1 serving of milk.   1 serving of strawberries. Step 3: Multiply each of those number of servings by 15 g:   2 servings of rice x 15 g = 30 g.   1 serving of corn x 15 g = 15 g.   1 serving of milk x 15 g = 15 g.   1 serving of strawberries x 15 g = 15 g. Step 4: Add together all of the amounts to find the total grams of carbohydrates eaten: 30 g + 15 g + 15 g + 15 g = 75 g.   This information is not intended to replace advice given to you by your health care provider. Make sure you discuss any questions you have with your health care provider.   Document Released: 03/17/2005 Document Revised: 04/07/2014 Document Reviewed: 02/11/2013 Elsevier Interactive Patient Education [employer]

## 2015-03-22 NOTE — Assessment & Plan Note (Signed)
msk chest pain. Patient low risk for CAD. No symptoms for PE. No reflux.  NSAID with food PPI reordered in case NSAID exacerbates GERD

## 2015-03-22 NOTE — Progress Notes (Signed)
C/C chest pain on and off. Worsen with positions  Pain scale #1 No tobacco user  No suicidal thought in the past two weeks

## 2015-03-27 ENCOUNTER — Telehealth: Payer: Self-pay

## 2015-03-27 NOTE — Telephone Encounter (Signed)
-----   Message from Dessa PhiJosalyn Funches, MD sent at 03/22/2015 10:06 AM EST ----- Normal A1c and CBG

## 2015-03-27 NOTE — Telephone Encounter (Signed)
Spoke with patient this am and he is aware of his lab results 

## 2015-04-25 IMAGING — CR DG LUMBAR SPINE COMPLETE 4+V
5 series · 5 of 5 positions shown · non-contrast
Comparison: None.

CLINICAL DATA: Low back pain

EXAM:
LUMBAR SPINE - COMPLETE 4+ VIEW

[t lumbar spine ap]
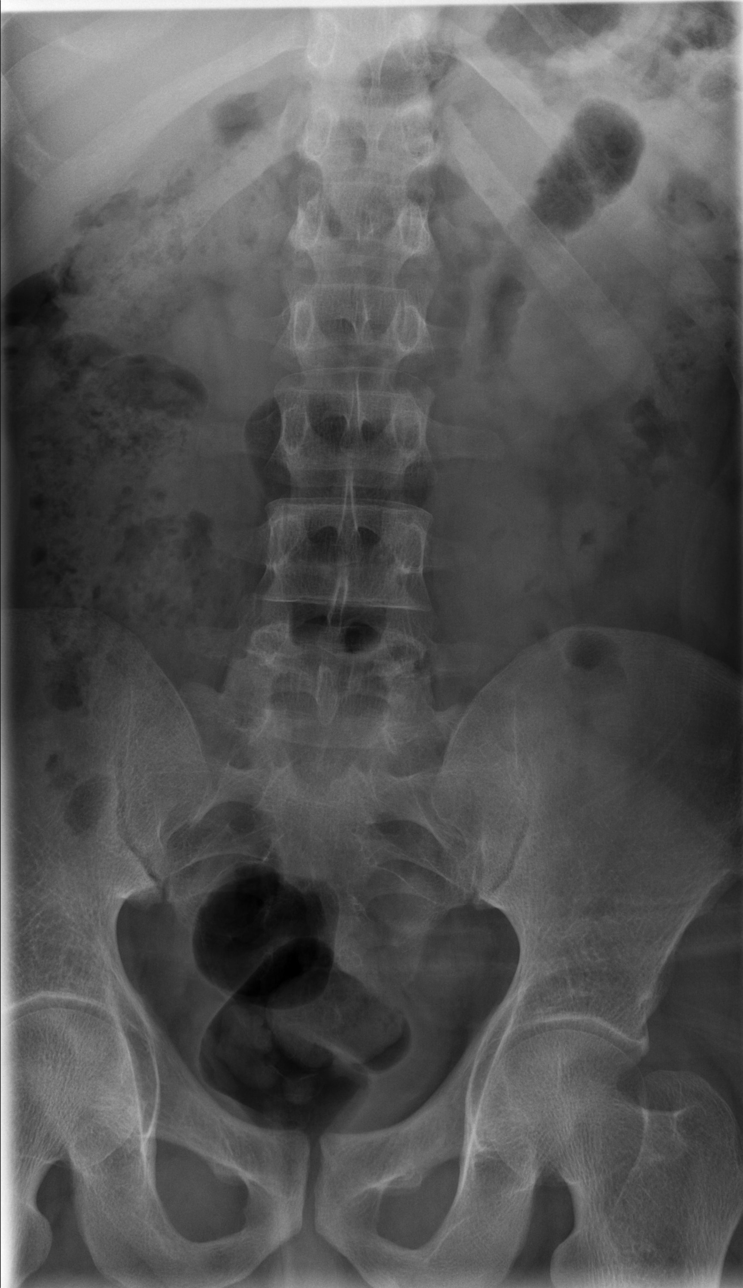

[t lumbar spine obl (1 of 2)]
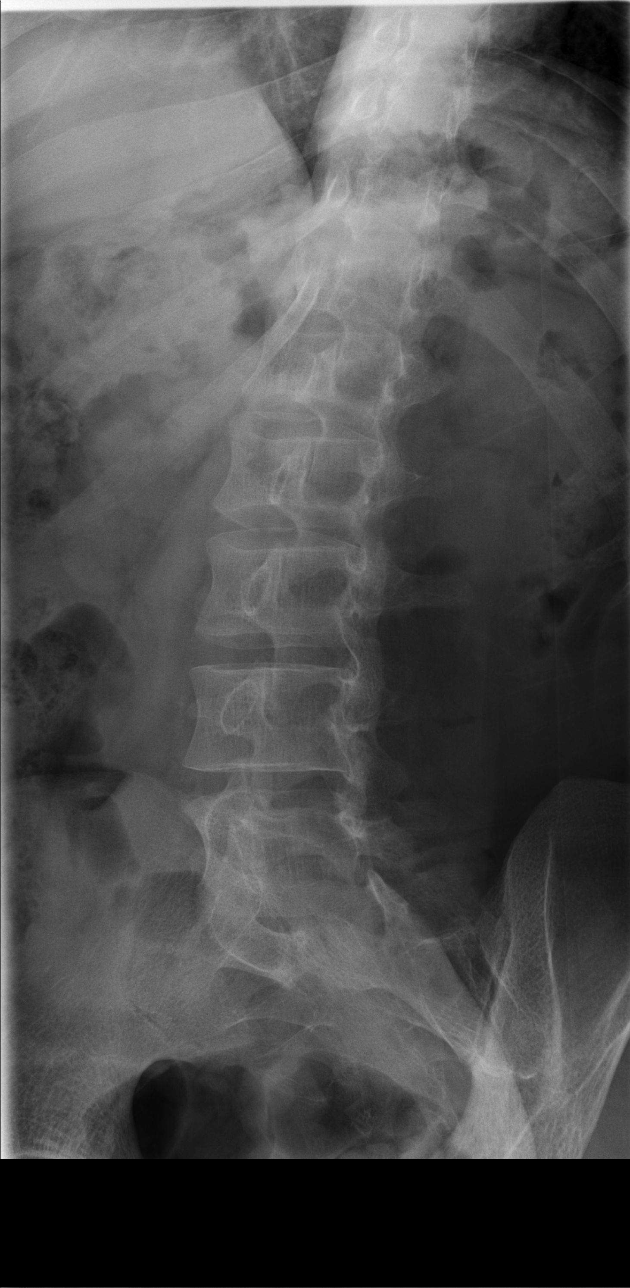

[t lumbar spine obl (2 of 2)]
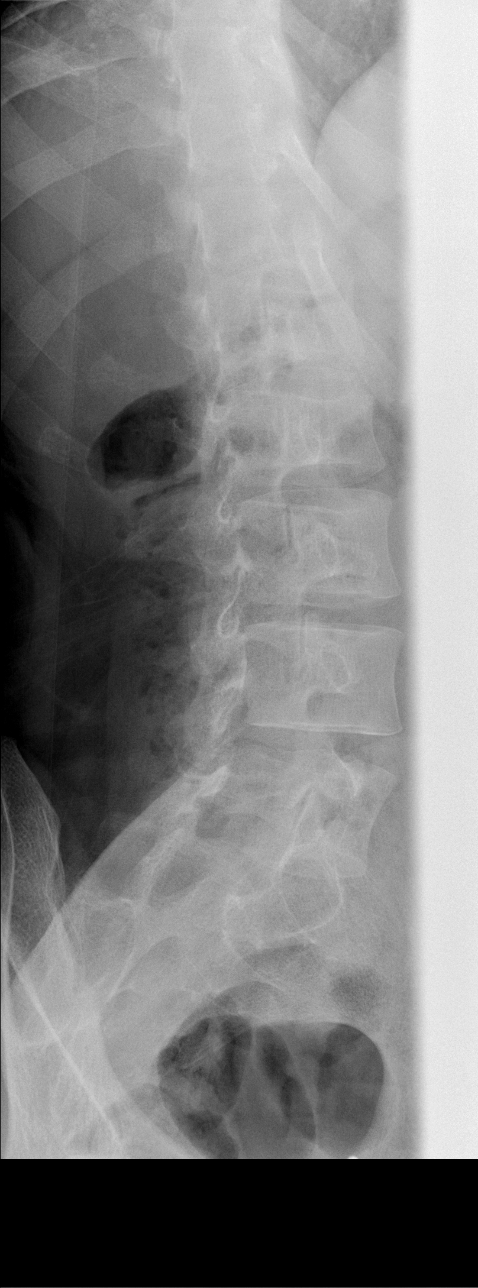

[t lumbar spine lat]
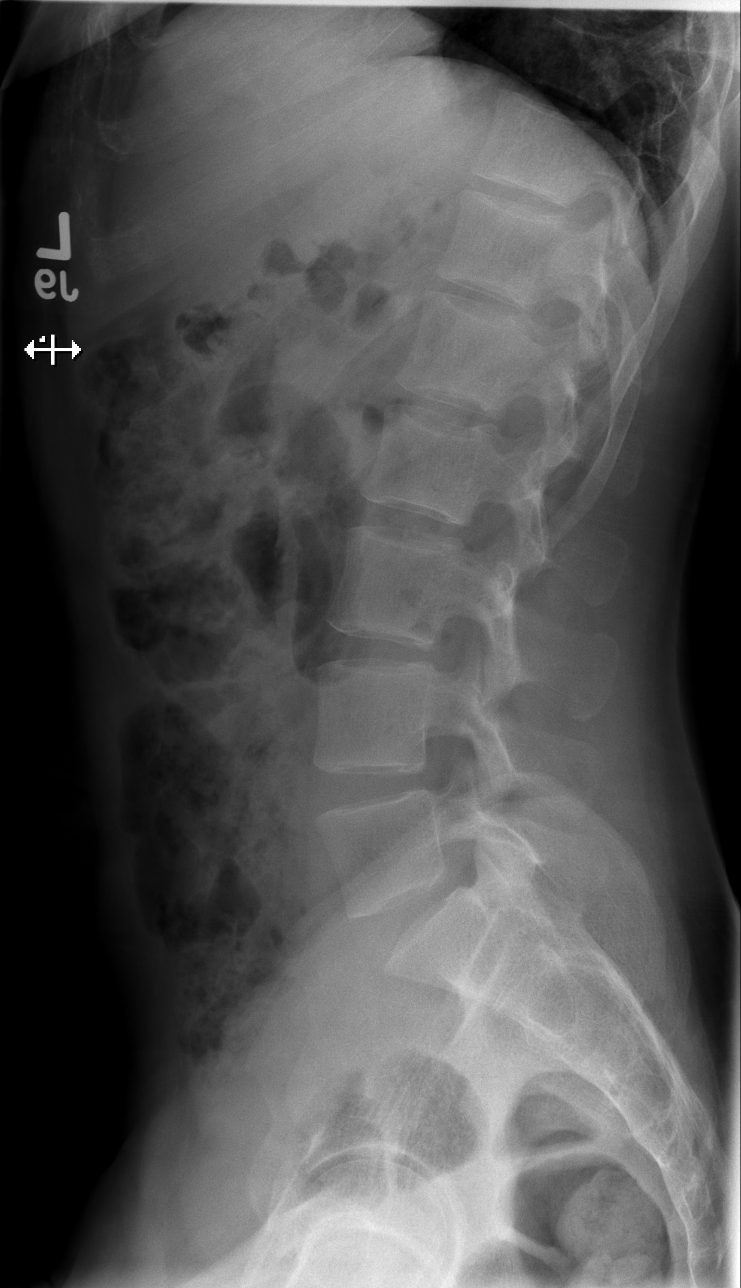

[t lumbar l-5 s-1 spot]
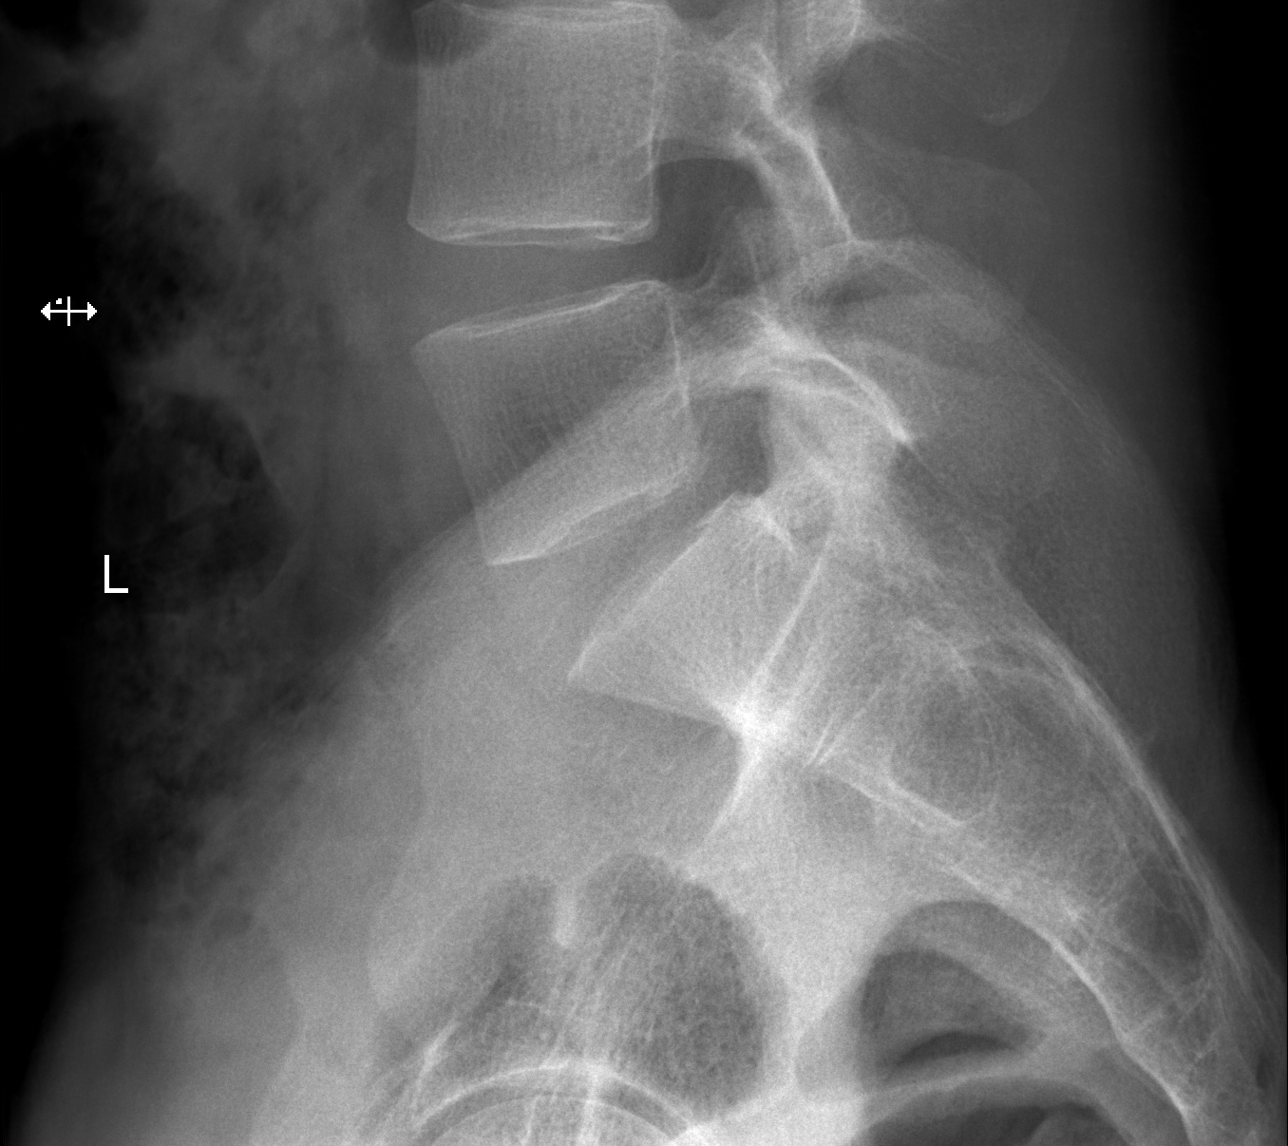

[5 of 5 positions shown; findings below may reference images not displayed]

FINDINGS: There is no evidence of lumbar spine fracture. Alignment is normal.
Intervertebral disc spaces are maintained.
IMPRESSION: Negative.

## 2015-08-03 ENCOUNTER — Ambulatory Visit: Payer: No Typology Code available for payment source | Attending: Family Medicine | Admitting: Family Medicine

## 2015-08-03 ENCOUNTER — Encounter: Payer: Self-pay | Admitting: Family Medicine

## 2015-08-03 VITALS — BP 99/62 | HR 60 | Temp 98.6°F | Resp 16 | Wt 133.0 lb

## 2015-08-03 DIAGNOSIS — Z79899 Other long term (current) drug therapy: Secondary | ICD-10-CM | POA: Diagnosis not present

## 2015-08-03 DIAGNOSIS — Z0000 Encounter for general adult medical examination without abnormal findings: Secondary | ICD-10-CM | POA: Diagnosis present

## 2015-08-03 DIAGNOSIS — H9193 Unspecified hearing loss, bilateral: Secondary | ICD-10-CM | POA: Diagnosis not present

## 2015-08-03 NOTE — Progress Notes (Signed)
Requesting referral for ENT  Difficulty hearing  No pain today  No tobacco user  No suicidal thoughts in the past two weeks

## 2015-08-03 NOTE — Patient Instructions (Signed)
Cory Francis was seen today for referral.  Diagnoses and all orders for this visit:  Decreased hearing of both ears -     Ambulatory referral to ENT   F/u in 6 months sooner if needed  Dr. Armen PickupFunches

## 2015-08-03 NOTE — Progress Notes (Signed)
   Subjective:  Patient ID: Cory Francis, male    DOB: 1991-03-02  Age: 25 y.o. MRN: [id number]  CC: Referral   HPI Cory Francis presents for   1. ENT referral: he reports decreased hearing in both ears L >R. He has hx of recurrent ear infections as a child. He was previously treated by ENT and was last seen in 2015. He does not recall if hearing aids were recommended. He denies hx of tympanostomy tube placement. He denies ear pain and discharge.   Social History  Substance Use Topics  . Smoking status: Never Smoker   . Smokeless tobacco: Not on file  . Alcohol Use: No    Outpatient Prescriptions Prior to Visit  Medication Sig Dispense Refill  . naproxen (NAPROSYN) 500 MG tablet Take 1 tablet (500 mg total) by mouth 2 (two) times daily with a meal. (Patient not taking: Reported on 08/03/2015) 30 tablet 0  . pantoprazole (PROTONIX) 40 MG tablet Take 1 tablet (40 mg total) by mouth daily as needed. (Patient not taking: Reported on 08/03/2015) 30 tablet 0   No facility-administered medications prior to visit.    ROS Review of Systems  Constitutional: Negative for fever, chills, fatigue and unexpected weight change.  HENT: Positive for hearing loss.   Eyes: Negative for visual disturbance.  Respiratory: Negative for cough and shortness of breath.   Cardiovascular: Negative for chest pain, palpitations and leg swelling.  Gastrointestinal: Negative for nausea, vomiting, abdominal pain, diarrhea, constipation and blood in stool.  Endocrine: Negative for polydipsia, polyphagia and polyuria.  Musculoskeletal: Negative for myalgias, back pain, arthralgias, gait problem and neck pain.  Skin: Negative for rash.  Allergic/Immunologic: Negative for immunocompromised state.  Hematological: Negative for adenopathy. Does not bruise/bleed easily.  Psychiatric/Behavioral: Negative for suicidal ideas, sleep disturbance and dysphoric mood. The patient is not nervous/anxious.     Objective:    BP 99/62 mmHg  Pulse 60  Temp(Src) 98.6 F (37 C) (Oral)  Resp 16  Wt 133 lb (60.328 kg)  SpO2 98%  BP/Weight 08/03/2015 03/22/2015 06/21/2014  Systolic BP 99 101 116  Diastolic BP 62 60 65  Wt. (Lbs) 133 132 128.6  BMI 19.08 18.94 18.45   Physical Exam  Constitutional: He appears well-developed and well-nourished. No distress.  HENT:  Head: Normocephalic and atraumatic.  Right Ear: External ear and ear canal normal. 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  Left Ear: External ear and ear canal normal. Tympanic membrane is scarred and retracted.  Nose: Nose normal.  Lymphadenopathy:    He has no cervical adenopathy.     Assessment & Plan:   Cory Francis was seen today for referral.  Diagnoses and all orders for this visit:  Decreased hearing of both ears -     Ambulatory referral to ENT   There are no diagnoses linked to this encounter.  No orders of the defined types were placed in this encounter.    Follow-up: No Follow-up on file.   Dessa PhiJosalyn Fields Oros MD

## 2015-08-03 NOTE — Assessment & Plan Note (Signed)
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

## 2015-11-06 ENCOUNTER — Ambulatory Visit: Payer: No Typology Code available for payment source | Attending: Family Medicine | Admitting: Audiology

## 2015-11-06 DIAGNOSIS — R94128 Abnormal results of other function studies of ear and other special senses: Secondary | ICD-10-CM | POA: Insufficient documentation

## 2015-11-06 DIAGNOSIS — Z0110 Encounter for examination of ears and hearing without abnormal findings: Secondary | ICD-10-CM | POA: Insufficient documentation

## 2015-11-06 DIAGNOSIS — H9011 Conductive hearing loss, unilateral, right ear, with unrestricted hearing on the contralateral side: Secondary | ICD-10-CM | POA: Diagnosis present

## 2015-11-06 DIAGNOSIS — Z8669 Personal history of other diseases of the nervous system and sense organs: Secondary | ICD-10-CM | POA: Diagnosis present

## 2015-11-06 DIAGNOSIS — H7293 Unspecified perforation of tympanic membrane, bilateral: Secondary | ICD-10-CM

## 2015-11-06 DIAGNOSIS — Z01118 Encounter for examination of ears and hearing with other abnormal findings: Secondary | ICD-10-CM | POA: Insufficient documentation

## 2015-11-06 DIAGNOSIS — H9072 Mixed conductive and sensorineural hearing loss, unilateral, left ear, with unrestricted hearing on the contralateral side: Secondary | ICD-10-CM

## 2015-11-06 NOTE — Procedures (Signed)
Outpatient Audiology and [facility]  [address]  [place], [place] [address]  [phone]   Audiological Evaluation  Patient Name: Cory Francis  Status: Outpatient   DOB: Jun 18, 1990    Diagnosis: Bilateral hearing loss MRN: [id number] Date:  11/06/2015     Referent: Cory PaulaFUNCHES, JOSALYN C, MD   History: Cory Francis was seen for an audiological evaluation.  Cory Francis states that he moved here from [place] 3 years ago and that was the "first time he had received medical care".  Cory Francis states that he had numerous ear infections as a young child with ear pain, ear drainage and hearing loss - especially on the left side. Cory Francis states that he was referred to an ENT, but since he "was new to this country" he was afraid to follow the recommendation for surgical correction of his middle ear issues.  However, he continues to experience hearing loss - especially in background noise and would like to follow-up with an ENT.  Cory Francis states that he feels like he "is speaking loudly" but others tell him he is speaking very softly.  He is attending [employer] and hopes to transfer to [employer] in [employer].    Evaluation: Conventional pure tone audiometry from 250Hz  - 8000Hz  with using insert earphones.  Hearing Thresholds: Right ear:  Thresholds of 60 dBHL at 250Hz ;50 dBHL at 500Hz  ;40 dBHL at 1000hz ; 25-30 dBHL from 2000Hz  - 8000Hz . Masked bone conduction hearing thresholds are 5-15 dBHL from 250Hz  - 8000Hz .   Left ear:    Thresholds are 70-75 dBHL from 250Hz  - 500Hz ; 60 dBHL at 1000Hz ; 40 dBHL at 2000Hz ; 55 dBHL at 4000Hz  and 40 dBHL at 8000Hz .  Masked bone conduction hearing thresholds are similar to the right ear air conduction hearing thresholds and there may be a masking dilemna - the left ear appears to have a mixed hearing loss.  Reliability is good Speech detection thresholds using recorded spondee word lists:  Right ear: 40 dBHL.  Left ear:  45 dBHL Word  recognition (at comfortably loud volumes) using recorded PBK word lists , in quiet.  Right ear: 96% at 75 dBHL.  Left ear:   92% at 85 dBHL Tympanometry (middle ear function) shows a large volume bilaterally (6.9cc) consistent with TM perforation.  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  He also has a significant hearing loss bilaterally that will adversely affect speech communication in the classroom and socially.  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  The right ear has a moderate low frequency improving to a slight high frequency conductive hearing loss.  Word recognition is excellent bilaterally at very loud levels equivalent to shouting at 1-2 feet away from his ear.   [facility] needs a referral to an ENT as soon as possible.  He also need amplification in the classroom such as a personal FM system.   RECOMMENDATIONS: 1.   Referral to an Ear, Nose and Throat physician for the bilateral abnormal middle ear function and hearing loss.   2.  Strategies that help improve hearing include: A) Face the speaker directly. Optimal is having the speakers face well - lit.  Unless amplified, being within 3-6 feet of the speaker will enhance word recognition. B) Word recognition is poorer in background noise. For optimal word recognition, turn off the TV, radio or noisy fan when engaging in conversation. In a restaurant, try to sit away from noise  sources and close to the primary speaker.  Francis)  Ask for topic clarification from time to time in order to remain in the conversation.  Most people don't mind repeating or clarifying a point when asked.  If needed, explain the difficulty hearing in background noise or hearing loss.  3.   Academic modification at college or [facility] for a 504 Plan will be needed for his hearing loss to include:  Cory Francis will have difficulty hearing and taking notes in the classroom. Provide  Cory Francis to a hard copy of class notes and assignment directions or email them to him.  Preferential seating is a must and is usually considered to be within 10 feet from where the teacher generally speaks.  -  as much as possible this should be away from noise sources, such as hall or street noise, ventilation fans or overhead projector noise etc.  Allow Cory Francis to record classes for review later at home.  Allow Cory Francis to utilize technology (computers, typing, smartpens, assistive listening devices, etc) in the classroom and at home to help remember and produce academic information. This is essential for those with an auditory processing deficit.  Provide a classroom or personal amplification system in the classroom.  The teacher wears a microphone and Cory Francis wears a headphone with a volume control so that he may hear the teacher at a comfortable listening level.   Deborah L. Kate Sable, Au.D., CCC-A Doctor of Audiology 11/06/2015
